# Patient Record
Sex: Female | Born: 1950 | Race: White | Hispanic: No | Marital: Married | Smoking: Never smoker
Health system: Southern US, Community
[De-identification: ages and names within clinical notes are randomized; demographics above are authoritative.]

## PROBLEM LIST (undated history)

## (undated) DIAGNOSIS — M25519 Pain in unspecified shoulder: Secondary | ICD-10-CM

## (undated) DIAGNOSIS — K219 Gastro-esophageal reflux disease without esophagitis: Secondary | ICD-10-CM

## (undated) DIAGNOSIS — G2581 Restless legs syndrome: Secondary | ICD-10-CM

## (undated) DIAGNOSIS — M8580 Other specified disorders of bone density and structure, unspecified site: Secondary | ICD-10-CM

## (undated) DIAGNOSIS — E039 Hypothyroidism, unspecified: Secondary | ICD-10-CM

## (undated) HISTORY — PX: TONSILLECTOMY AND ADENOIDECTOMY: SHX28

## (undated) HISTORY — PX: ABDOMINAL HYSTERECTOMY: SHX81

---

## 1978-08-09 HISTORY — PX: LAPAROSCOPIC OOPHERECTOMY: SHX6507

## 1998-08-09 HISTORY — PX: ABDOMINOPLASTY: SUR9

## 2004-06-10 ENCOUNTER — Ambulatory Visit: Payer: Self-pay | Admitting: Family Medicine

## 2004-12-08 LAB — HM COLONOSCOPY

## 2004-12-18 ENCOUNTER — Ambulatory Visit: Payer: Self-pay | Admitting: Unknown Physician Specialty

## 2006-08-09 HISTORY — PX: CHOLECYSTECTOMY: SHX55

## 2006-12-13 DIAGNOSIS — G4700 Insomnia, unspecified: Secondary | ICD-10-CM | POA: Insufficient documentation

## 2006-12-20 ENCOUNTER — Ambulatory Visit: Payer: Self-pay | Admitting: Family Medicine

## 2007-01-26 ENCOUNTER — Ambulatory Visit: Payer: Self-pay | Admitting: Family Medicine

## 2008-04-13 IMAGING — US ABDOMEN ULTRASOUND
1 series · 17 of 25 positions shown · non-contrast
Comparison: none

REASON FOR EXAM: abd pain  focus on gallbladder
COMMENTS:

[Series 1: abdomen ultrasound · 17 of 51 slices shown]
[im 1/51]
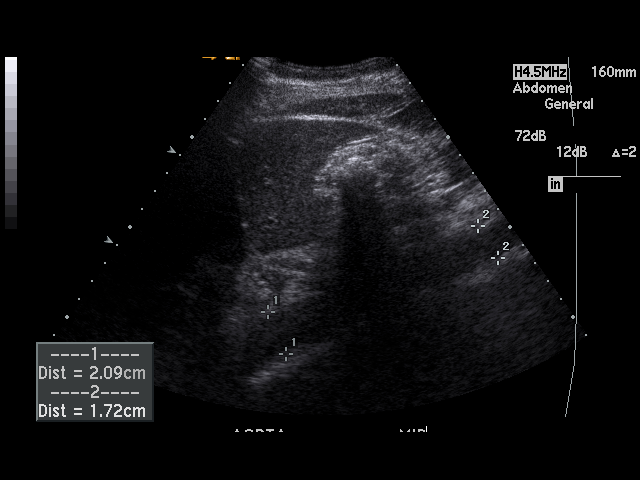
[im 5/51]
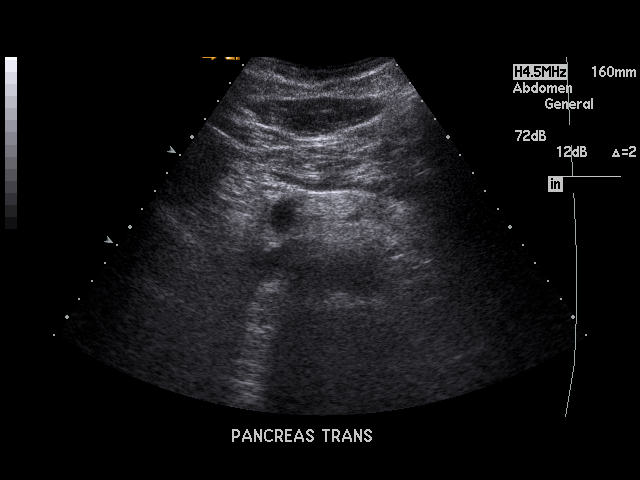
[im 7/51]
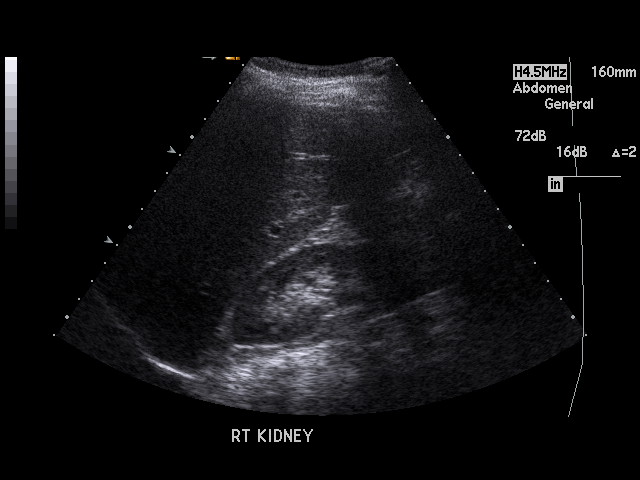
[im 11/51]
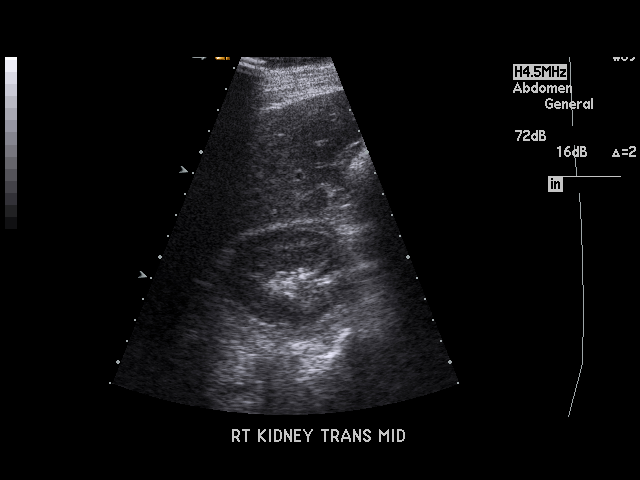
[im 13/51]
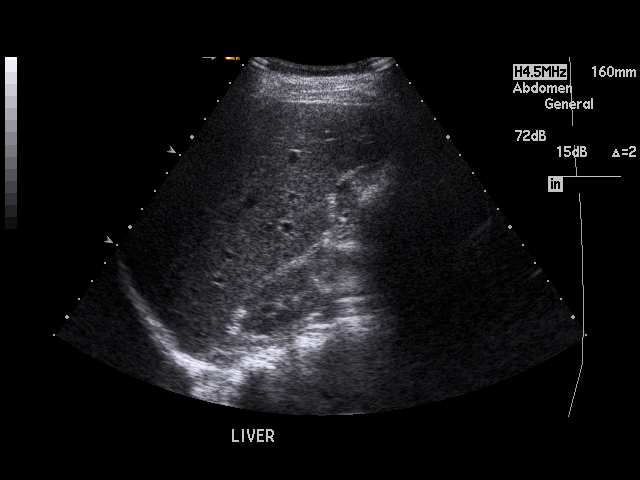
[im 17/51]
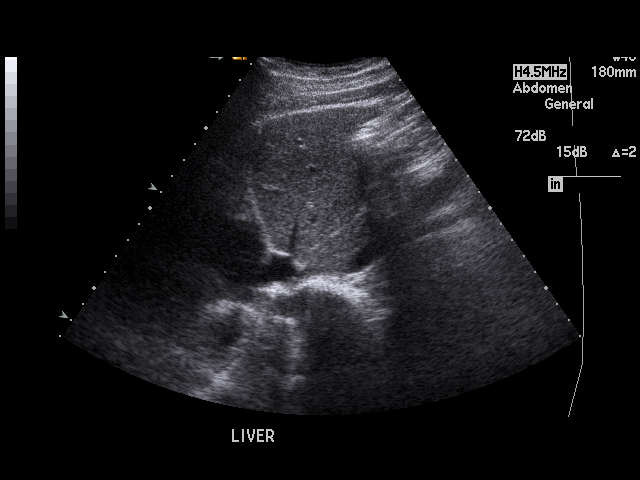
[im 19/51]
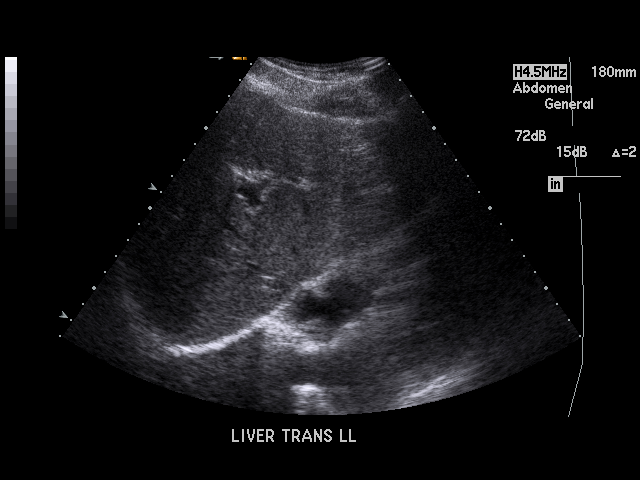
[im 23/51]
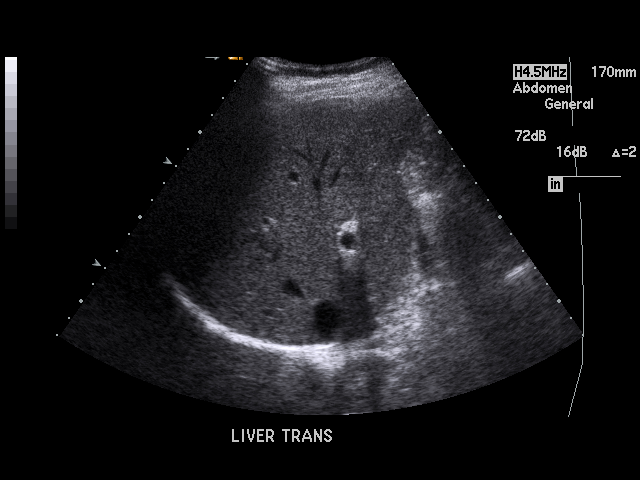
[im 26/51]
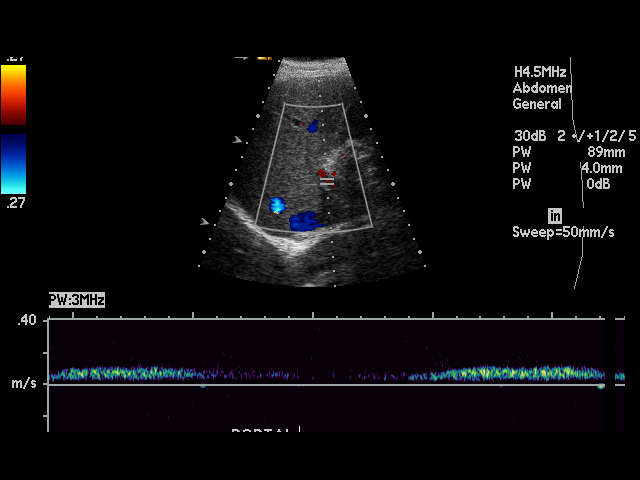
[im 28/51]
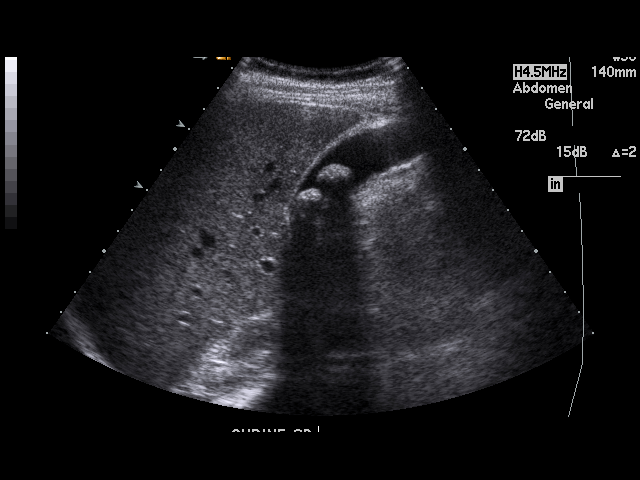
[im 32/51]
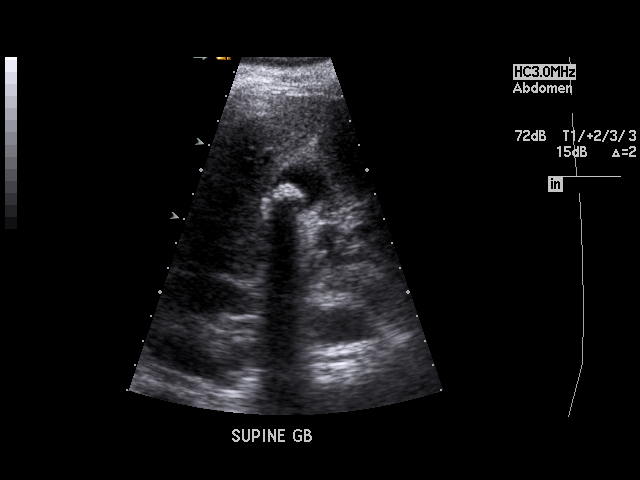
[im 34/51]
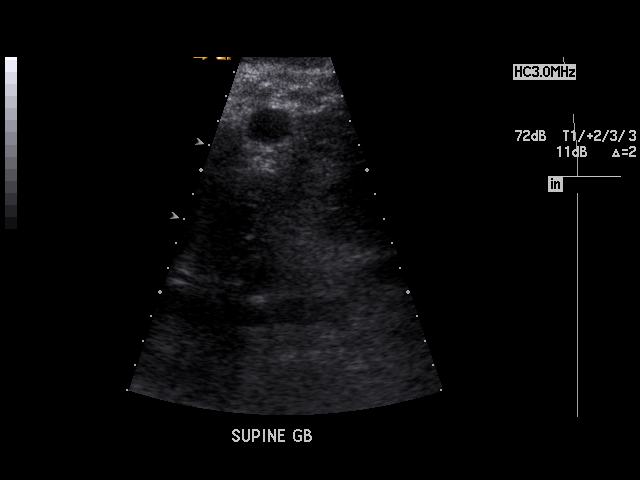
[im 38/51]
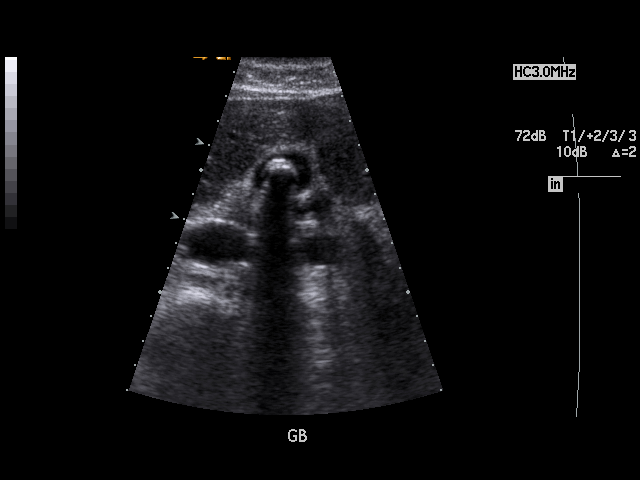
[im 40/51]
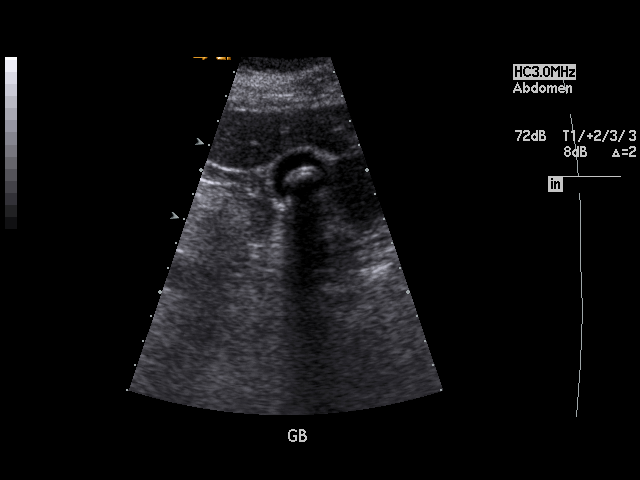
[im 44/51]
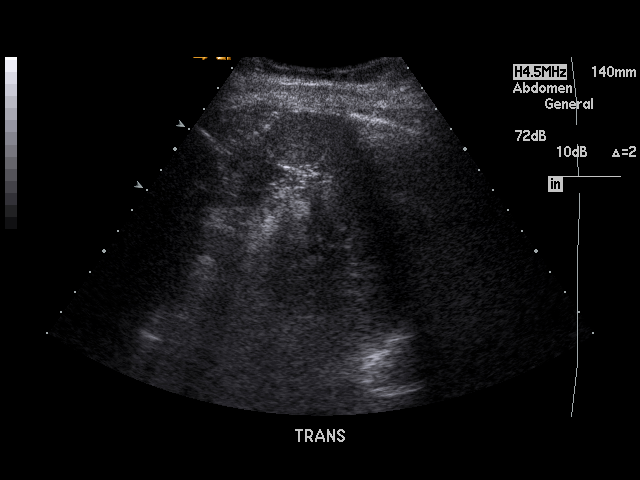
[im 46/51]
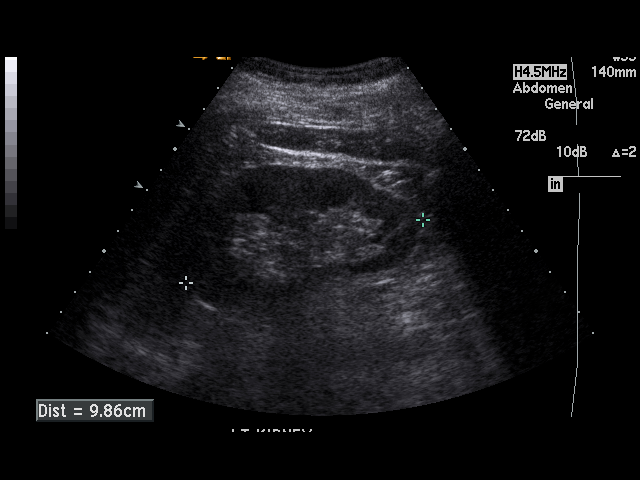
[im 51/51]
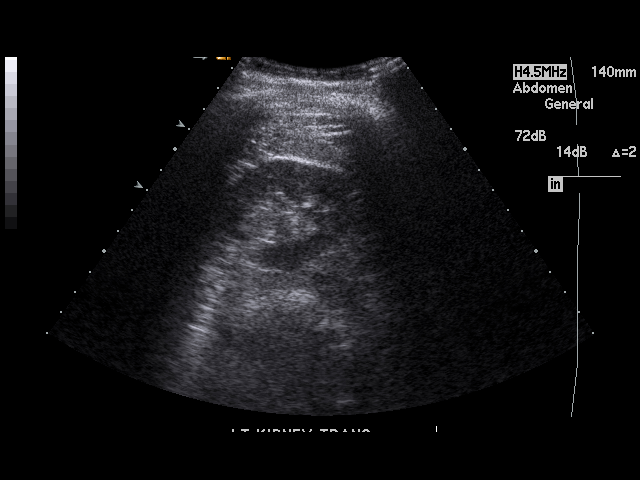

[17 of 25 positions shown; findings below may reference images not displayed]

PROCEDURE:     US  - US ABDOMEN GENERAL SURVEY  - December 20, 2006  [DATE]

RESULT:     The liver, spleen and abdominal aorta show no significant
abnormalities. The pancreas is not visualized adequately for evaluation on
this exam. There are noted at least two echo densities in the gallbladder
compatible gallstones. The larger measures 1.38 cm at maximum number. There
is no thickening of the gallbladder wall. The common bile duct measures
mm in diameter which is within normal limits. The kidney shadow
hydronephrosis. There is no ascites.
IMPRESSION: 1. Cholelithiasis.
2. The pancreas is not optimally visualized on this exam.

## 2008-05-01 ENCOUNTER — Ambulatory Visit: Payer: Self-pay | Admitting: Family Medicine

## 2009-04-25 DIAGNOSIS — M25519 Pain in unspecified shoulder: Secondary | ICD-10-CM | POA: Insufficient documentation

## 2009-05-28 ENCOUNTER — Ambulatory Visit: Payer: Self-pay | Admitting: Family Medicine

## 2010-07-07 ENCOUNTER — Ambulatory Visit: Payer: Self-pay | Admitting: Family Medicine

## 2011-10-25 LAB — HM DEXA SCAN

## 2012-02-15 ENCOUNTER — Ambulatory Visit: Payer: Self-pay | Admitting: Family Medicine

## 2013-07-23 ENCOUNTER — Ambulatory Visit: Payer: Self-pay | Admitting: Family Medicine

## 2013-09-05 ENCOUNTER — Ambulatory Visit: Payer: Self-pay | Admitting: Unknown Physician Specialty

## 2013-09-10 LAB — PATHOLOGY REPORT

## 2014-07-29 LAB — HM PAP SMEAR: HM PAP: NEGATIVE

## 2014-07-30 LAB — TSH: TSH: 4.97 u[IU]/mL (ref 0.41–5.90)

## 2014-07-30 LAB — LIPID PANEL
CHOLESTEROL: 211 mg/dL — AB (ref 0–200)
HDL: 62 mg/dL (ref 35–70)
LDL CALC: 118 mg/dL
TRIGLYCERIDES: 154 mg/dL (ref 40–160)

## 2014-07-30 LAB — BASIC METABOLIC PANEL
BUN: 18 mg/dL (ref 4–21)
Creatinine: 1 mg/dL (ref 0.5–1.1)
Glucose: 87 mg/dL
Potassium: 4.4 mmol/L (ref 3.4–5.3)
Sodium: 144 mmol/L (ref 137–147)

## 2014-07-30 LAB — CBC AND DIFFERENTIAL
HCT: 43 % (ref 36–46)
HEMOGLOBIN: 14.3 g/dL (ref 12.0–16.0)
PLATELETS: 284 10*3/uL (ref 150–399)
WBC: 7.8 10*3/mL

## 2014-07-30 LAB — HEPATIC FUNCTION PANEL
ALT: 16 U/L (ref 7–35)
AST: 16 U/L (ref 13–35)

## 2014-09-27 ENCOUNTER — Ambulatory Visit: Payer: Self-pay | Admitting: Family Medicine

## 2014-09-27 LAB — HM MAMMOGRAPHY

## 2015-01-25 ENCOUNTER — Other Ambulatory Visit: Payer: Self-pay | Admitting: Family Medicine

## 2015-01-25 DIAGNOSIS — G2581 Restless legs syndrome: Secondary | ICD-10-CM

## 2015-01-27 DIAGNOSIS — G2581 Restless legs syndrome: Secondary | ICD-10-CM | POA: Insufficient documentation

## 2015-01-28 ENCOUNTER — Other Ambulatory Visit: Payer: Self-pay | Admitting: Family Medicine

## 2015-01-28 DIAGNOSIS — G2581 Restless legs syndrome: Secondary | ICD-10-CM

## 2015-01-28 NOTE — Telephone Encounter (Signed)
Last refill and OV 07/29/2014. Pt currently in [place], [place]. May need a verbal order for this med. Allene Dillon, CMA

## 2015-05-05 ENCOUNTER — Other Ambulatory Visit: Payer: Self-pay | Admitting: Family Medicine

## 2015-05-05 DIAGNOSIS — G4700 Insomnia, unspecified: Secondary | ICD-10-CM

## 2015-05-05 NOTE — Telephone Encounter (Signed)
Needs ov in next few months. Thanks.

## 2015-05-05 NOTE — Telephone Encounter (Signed)
Last OV 07/2014  Thanks,   -Laura  

## 2015-06-24 ENCOUNTER — Encounter: Payer: Self-pay | Admitting: Family Medicine

## 2015-06-24 ENCOUNTER — Ambulatory Visit (INDEPENDENT_AMBULATORY_CARE_PROVIDER_SITE_OTHER): Payer: Federal, State, Local not specified - PPO | Admitting: Family Medicine

## 2015-06-24 VITALS — BP 122/82 | HR 88 | Temp 98.2°F | Resp 16 | Ht 68.5 in | Wt 204.0 lb

## 2015-06-24 DIAGNOSIS — E038 Other specified hypothyroidism: Secondary | ICD-10-CM

## 2015-06-24 DIAGNOSIS — Z1211 Encounter for screening for malignant neoplasm of colon: Secondary | ICD-10-CM | POA: Insufficient documentation

## 2015-06-24 DIAGNOSIS — E039 Hypothyroidism, unspecified: Secondary | ICD-10-CM

## 2015-06-24 DIAGNOSIS — R928 Other abnormal and inconclusive findings on diagnostic imaging of breast: Secondary | ICD-10-CM | POA: Insufficient documentation

## 2015-06-24 DIAGNOSIS — G2581 Restless legs syndrome: Secondary | ICD-10-CM

## 2015-06-24 DIAGNOSIS — M8580 Other specified disorders of bone density and structure, unspecified site: Secondary | ICD-10-CM | POA: Insufficient documentation

## 2015-06-24 DIAGNOSIS — K219 Gastro-esophageal reflux disease without esophagitis: Secondary | ICD-10-CM | POA: Diagnosis not present

## 2015-06-24 DIAGNOSIS — G4700 Insomnia, unspecified: Secondary | ICD-10-CM | POA: Diagnosis not present

## 2015-06-24 DIAGNOSIS — Z6829 Body mass index (BMI) 29.0-29.9, adult: Secondary | ICD-10-CM | POA: Insufficient documentation

## 2015-06-24 DIAGNOSIS — R631 Polydipsia: Secondary | ICD-10-CM | POA: Diagnosis not present

## 2015-06-24 DIAGNOSIS — E663 Overweight: Secondary | ICD-10-CM | POA: Insufficient documentation

## 2015-06-24 MED ORDER — OMEPRAZOLE 20 MG PO CPDR: 20.0000 mg | DELAYED_RELEASE_CAPSULE | Freq: Every day | ORAL | Status: DC

## 2015-06-24 MED ORDER — CLONAZEPAM 1 MG PO TABS: 1.0000 mg | ORAL_TABLET | Freq: Every day | ORAL | Status: DC

## 2015-06-24 NOTE — Progress Notes (Signed)
Subjective:    Patient ID: Janice Jensen, female    DOB: November 08, 1950, 64 y.o.   MRN: [id number]  Gastroesophageal Reflux She reports no abdominal pain, no belching, no chest pain, no choking, no coughing, no dysphagia, no globus sensation, no heartburn, no hoarse voice, no nausea, no sore throat, no water brash or no wheezing. This is a chronic problem. The problem has been gradually improving. Associated symptoms include fatigue. She has tried a PPI (Omeprazole 20 mg) for the symptoms. The treatment provided significant relief. Past procedures include an EGD.   Restless Leg Syndrome Pt currently takes Klonopin 1 mg po qhs for RLS. Pt reports significant relief with this medication. No side effects to medication.   Is having some fatigue. Does want her TSH rechecked. Also, does need to have a colonoscopy scheduled.     Review of Systems  Constitutional: Positive for fatigue.  HENT: Negative for hoarse voice and sore throat.   Respiratory: Negative for cough, choking and wheezing.   Cardiovascular: Negative for chest pain.  Gastrointestinal: Negative for heartburn, dysphagia, nausea and abdominal pain.  Endocrine: Positive for polydipsia.   BP 122/82 mmHg  Pulse 88  Temp(Src) 98.2 F (36.8 C) (Oral)  Resp 16  Ht 5' 8.5" (1.74 m)  Wt 204 lb (92.534 kg)  BMI 30.56 kg/m2   Patient Active Problem List   Diagnosis Date Noted  . Abnormal mammogram 06/24/2015  . Body mass index (BMI) of 29.0-29.9 in adult 06/24/2015  . Acid reflux 06/24/2015  . Excess weight 06/24/2015  . Osteopenia 06/24/2015  . Subclinical hypothyroidism 06/24/2015  . Restless leg 01/27/2015  . Arthralgia of shoulder 04/25/2009  . Cannot sleep 12/13/2006   No past medical history on file. Current Outpatient Prescriptions on File Prior to Visit  Medication Sig  . clonazePAM (KLONOPIN) 1 MG tablet TAKE 1 TABLET BY MOUTH EVERY DAY AT BEDTIME   No current facility-administered medications on file prior to  visit.   No Known Allergies Past Surgical History  Procedure Laterality Date  . Cholecystectomy  2008  . Abdominoplasty  2000  . Laparoscopic oopherectomy Bilateral 1980  . Abdominal hysterectomy    . Tonsillectomy and adenoidectomy     Social History   Social History  . Marital Status: Married    Spouse Name: N/A  . Number of Children: N/A  . Years of Education: N/A   Occupational History  . Not on file.   Social History Main Topics  . Smoking status: Never Smoker   . Smokeless tobacco: Never Used  . Alcohol Use: No  . Drug Use: No  . Sexual Activity: Not on file   Other Topics Concern  . Not on file   Social History Narrative   Family History  Problem Relation Age of Onset  . COPD Mother   . Lung cancer Father   . Cancer Sister     Laryngeal  . Drug abuse Brother   . Healthy Daughter   . Healthy Sister   . Bipolar disorder Sister       Objective:   Physical Exam  Constitutional: She is oriented to person, place, and time. She appears well-developed and well-nourished.  Neurological: She is alert and oriented to person, place, and time.  Psychiatric: She has a normal mood and affect. Her behavior is normal. Judgment and thought content normal.   BP 122/82 mmHg  Pulse 88  Temp(Src) 98.2 F (36.8 C) (Oral)  Resp 16  Ht 5' 8.5" (1.74 m)  Wt 204 lb (92.534 kg)  BMI 30.56 kg/m2      Assessment & Plan:  1. Gastroesophageal reflux disease, esophagitis presence not specified Condition is stable. Please continue current medication and  plan of care as noted.  Marland Kitchennjmcwin  - omeprazole (PRILOSEC) 20 MG capsule; Take 1 capsule (20 mg total) by mouth daily.  Dispense: 90 capsule; Refill: 1  2. Restless leg Stable. Continue current medication.   - clonazePAM (KLONOPIN) 1 MG tablet; Take 1 tablet (1 mg total) by mouth at bedtime.  Dispense: 90 tablet; Refill: 1  3. Cannot sleep Stable.    4. Subclinical hypothyroidism Time for low recheck. Has been mor  fatigued recently.   - T4 AND TSH  5. Increased thirst Worsening. Will check labs.   - Hemoglobin A1c  6. Colon cancer screening Will refer.   - Ambulatory referral to Gastroenterology   Lorie Phenix, MD

## 2015-06-25 ENCOUNTER — Telehealth: Payer: Self-pay

## 2015-06-25 LAB — T4 AND TSH
T4, Total: 7.5 ug/dL (ref 4.5–12.0)
TSH: 3.51 u[IU]/mL (ref 0.450–4.500)

## 2015-06-25 LAB — HEMOGLOBIN A1C
ESTIMATED AVERAGE GLUCOSE: 126 mg/dL
HEMOGLOBIN A1C: 6 % — AB (ref 4.8–5.6)

## 2015-06-25 NOTE — Telephone Encounter (Signed)
Pt advised.   Thanks,   -Laura  

## 2015-06-25 NOTE — Telephone Encounter (Signed)
-----   Message from Lorie PhenixNancy Maloney, MD sent at 06/25/2015 10:12 AM EST ----- Labs stable. Blood sugar is up some, in early pre-diabetes range. Make sure to eat healthy and exercise.  Recheck in 3 months. Thanks.

## 2015-06-27 ENCOUNTER — Other Ambulatory Visit: Payer: Self-pay

## 2015-06-27 DIAGNOSIS — K219 Gastro-esophageal reflux disease without esophagitis: Secondary | ICD-10-CM

## 2015-06-27 MED ORDER — OMEPRAZOLE 20 MG PO CPDR: 20.0000 mg | DELAYED_RELEASE_CAPSULE | Freq: Every day | ORAL | Status: DC

## 2015-07-23 ENCOUNTER — Encounter: Payer: Self-pay | Admitting: *Deleted

## 2015-07-24 ENCOUNTER — Encounter: Payer: Self-pay | Admitting: *Deleted

## 2015-07-24 ENCOUNTER — Encounter
Admission: RE | Disposition: A | Discharge status: Home / Self Care | Payer: Self-pay | Source: Ambulatory Visit | Attending: Unknown Physician Specialty

## 2015-07-24 ENCOUNTER — Ambulatory Visit
Admission: RE | Admit: 2015-07-24 | Discharge: 2015-07-24 | Disposition: A | Discharge status: Home / Self Care | Payer: Federal, State, Local not specified - PPO | Source: Ambulatory Visit | Attending: Unknown Physician Specialty | Admitting: Unknown Physician Specialty

## 2015-07-24 ENCOUNTER — Ambulatory Visit: Payer: Federal, State, Local not specified - PPO | Admitting: Anesthesiology

## 2015-07-24 DIAGNOSIS — Z79899 Other long term (current) drug therapy: Secondary | ICD-10-CM | POA: Insufficient documentation

## 2015-07-24 DIAGNOSIS — K219 Gastro-esophageal reflux disease without esophagitis: Secondary | ICD-10-CM | POA: Diagnosis not present

## 2015-07-24 DIAGNOSIS — K621 Rectal polyp: Secondary | ICD-10-CM | POA: Insufficient documentation

## 2015-07-24 DIAGNOSIS — M8580 Other specified disorders of bone density and structure, unspecified site: Secondary | ICD-10-CM | POA: Diagnosis not present

## 2015-07-24 DIAGNOSIS — Z1211 Encounter for screening for malignant neoplasm of colon: Secondary | ICD-10-CM | POA: Insufficient documentation

## 2015-07-24 DIAGNOSIS — E039 Hypothyroidism, unspecified: Secondary | ICD-10-CM | POA: Insufficient documentation

## 2015-07-24 DIAGNOSIS — E669 Obesity, unspecified: Secondary | ICD-10-CM | POA: Insufficient documentation

## 2015-07-24 DIAGNOSIS — G2581 Restless legs syndrome: Secondary | ICD-10-CM | POA: Diagnosis not present

## 2015-07-24 DIAGNOSIS — K5730 Diverticulosis of large intestine without perforation or abscess without bleeding: Secondary | ICD-10-CM | POA: Diagnosis not present

## 2015-07-24 DIAGNOSIS — Z6829 Body mass index (BMI) 29.0-29.9, adult: Secondary | ICD-10-CM | POA: Diagnosis not present

## 2015-07-24 HISTORY — DX: Pain in unspecified shoulder: M25.519

## 2015-07-24 HISTORY — PX: COLONOSCOPY WITH PROPOFOL: SHX5780

## 2015-07-24 HISTORY — DX: Hypothyroidism, unspecified: E03.9

## 2015-07-24 HISTORY — DX: Gastro-esophageal reflux disease without esophagitis: K21.9

## 2015-07-24 HISTORY — DX: Restless legs syndrome: G25.81

## 2015-07-24 HISTORY — DX: Other specified disorders of bone density and structure, unspecified site: M85.80

## 2015-07-24 SURGERY — COLONOSCOPY WITH PROPOFOL
Anesthesia: General

## 2015-07-24 MED ORDER — SODIUM CHLORIDE 0.9 % IV SOLN
INTRAVENOUS | Status: DC
  Administered 2015-07-24: 10:00:00 via INTRAVENOUS

## 2015-07-24 MED ORDER — PROPOFOL 500 MG/50ML IV EMUL
INTRAVENOUS | Status: DC | PRN
  Administered 2015-07-24: 120 ug/kg/min via INTRAVENOUS

## 2015-07-24 MED ORDER — PROPOFOL 10 MG/ML IV BOLUS
INTRAVENOUS | Status: DC | PRN
  Administered 2015-07-24: 50 mg via INTRAVENOUS

## 2015-07-24 MED ORDER — SODIUM CHLORIDE 0.9 % IV SOLN: INTRAVENOUS | Status: DC

## 2015-07-24 MED ORDER — FENTANYL CITRATE (PF) 100 MCG/2ML IJ SOLN
INTRAMUSCULAR | Status: DC | PRN
  Administered 2015-07-24: 50 ug via INTRAVENOUS

## 2015-07-24 MED ORDER — MIDAZOLAM HCL 5 MG/5ML IJ SOLN
INTRAMUSCULAR | Status: DC | PRN
  Administered 2015-07-24: 1 mg via INTRAVENOUS

## 2015-07-24 MED ORDER — LIDOCAINE HCL (CARDIAC) 20 MG/ML IV SOLN
INTRAVENOUS | Status: DC | PRN
  Administered 2015-07-24: 67 mg via INTRAVENOUS

## 2015-07-24 NOTE — Anesthesia Preprocedure Evaluation (Signed)
Anesthesia Evaluation  Patient identified by MRN, date of birth, ID band Patient awake    Reviewed: Allergy & Precautions, NPO status , Patient's Chart, lab work & pertinent test results  History of Anesthesia Complications Negative for: history of anesthetic complications  Airway Mallampati: II       Dental no notable dental hx.    Pulmonary neg pulmonary ROS,    breath sounds clear to auscultation       Cardiovascular Exercise Tolerance: Good  Rhythm:Regular     Neuro/Psych    GI/Hepatic Neg liver ROS, GERD  ,  Endo/Other  Hypothyroidism   Renal/GU negative Renal ROS     Musculoskeletal   Abdominal (+) + obese,   Peds negative pediatric ROS (+)  Hematology negative hematology ROS (+)   Anesthesia Other Findings   Reproductive/Obstetrics                             Anesthesia Physical Anesthesia Plan  ASA: II  Anesthesia Plan: General   Post-op Pain Management:    Induction: Intravenous  Airway Management Planned: Nasal Cannula  Additional Equipment:   Intra-op Plan:   Post-operative Plan:   Informed Consent: I have reviewed the patients History and Physical, chart, labs and discussed the procedure including the risks, benefits and alternatives for the proposed anesthesia with the patient or authorized representative who has indicated his/her understanding and acceptance.     Plan Discussed with: CRNA  Anesthesia Plan Comments:         Anesthesia Quick Evaluation

## 2015-07-24 NOTE — Transfer of Care (Signed)
Immediate Anesthesia Transfer of Care Note  Patient: Janice HittBrenda L Jensen  Procedure(s) Performed: Procedure(s): COLONOSCOPY WITH PROPOFOL (N/A)  Patient Location: PACU  Anesthesia Type:General  Level of Consciousness: sedated  Airway & Oxygen Therapy: Patient Spontanous Breathing and Patient connected to nasal cannula oxygen  Post-op Assessment: Report given to RN and Post -op Vital signs reviewed and stable  Post vital signs: Reviewed and stable  Last Vitals:  Filed Vitals:   07/24/15 1059 07/24/15 1100  BP: 97/60 97/60  Pulse: 60 64  Temp: 36 C   Resp: 13 15    Complications: No apparent anesthesia complications

## 2015-07-24 NOTE — Anesthesia Postprocedure Evaluation (Signed)
Anesthesia Post Note  Patient: Janice Jensen  Procedure(s) Performed: Procedure(s) (LRB): COLONOSCOPY WITH PROPOFOL (N/A)  Patient location during evaluation: PACU Anesthesia Type: General Level of consciousness: awake and alert Pain management: satisfactory to patient Vital Signs Assessment: post-procedure vital signs reviewed and stable Respiratory status: spontaneous breathing Cardiovascular status: stable Anesthetic complications: no    Last Vitals:  Filed Vitals:   07/24/15 0919 07/24/15 1050  BP: 136/82   Pulse: 51   Temp: 36.5 C 36 C  Resp: 18     Last Pain: There were no vitals filed for this visit.               VAN STAVEREN,Laquilla Dault

## 2015-07-24 NOTE — H&P (Signed)
   Primary Care Physician:  Lorie PhenixNancy Maloney, MD Primary Gastroenterologist:  Dr. Mechele CollinElliott  Pre-Procedure History & Physical: HPI:  Janice Jensen is a 64 y.o. female is here for an colonoscopy.   Past Medical History  Diagnosis Date  . GERD (gastroesophageal reflux disease)   . Osteopenia   . Hypothyroidism   . Restless leg syndrome   . Arthralgia of shoulder     Past Surgical History  Procedure Laterality Date  . Cholecystectomy  2008  . Abdominoplasty  2000  . Laparoscopic oopherectomy Bilateral 1980  . Abdominal hysterectomy    . Tonsillectomy and adenoidectomy      Prior to Admission medications   Medication Sig Start Date End Date Taking? Authorizing Provider  clonazePAM (KLONOPIN) 1 MG tablet Take 1 tablet (1 mg total) by mouth at bedtime. 06/24/15  Yes Lorie PhenixNancy Maloney, MD  omeprazole (PRILOSEC) 20 MG capsule Take 1 capsule (20 mg total) by mouth daily. 06/27/15  Yes Lorie PhenixNancy Maloney, MD    Allergies as of 07/21/2015  . (No Known Allergies)    Family History  Problem Relation Age of Onset  . COPD Mother   . Lung cancer Father   . Cancer Sister     Laryngeal  . Drug abuse Brother   . Healthy Daughter   . Healthy Sister   . Bipolar disorder Sister     Social History   Social History  . Marital Status: Married    Spouse Name: N/A  . Number of Children: N/A  . Years of Education: N/A   Occupational History  . Not on file.   Social History Main Topics  . Smoking status: Never Smoker   . Smokeless tobacco: Never Used  . Alcohol Use: No  . Drug Use: No  . Sexual Activity: Not on file   Other Topics Concern  . Not on file   Social History Narrative    Review of Systems: See HPI, otherwise negative ROS  Physical Exam: BP 136/82 mmHg  Pulse 51  Temp(Src) 97.7 F (36.5 C) (Tympanic)  Resp 18  Ht 5\' 8"  (1.727 m)  Wt 88.451 kg (195 lb)  BMI 29.66 kg/m2  SpO2 100% General:   Alert,  pleasant and cooperative in NAD Head:  Normocephalic and  atraumatic. Neck:  Supple; no masses or thyromegaly. Lungs:  Clear throughout to auscultation.    Heart:  Regular rate and rhythm. Abdomen:  Soft, nontender and nondistended. Normal bowel sounds, without guarding, and without rebound.   Neurologic:  Alert and  oriented x4;  grossly normal neurologically.  Impression/Plan: Janice Jensen is here for an colonoscopy to be performed for screening  Risks, benefits, limitations, and alternatives regarding  colonoscopy have been reviewed with the patient.  Questions have been answered.  All parties agreeable.   Lynnae PrudeELLIOTT, [place], MD  07/24/2015, 10:25 AM

## 2015-07-24 NOTE — Op Note (Signed)
[facility] Gastroenterology Patient Name: Janice NumbersBrenda Jensen Procedure Date: 07/24/2015 10:19 AM MRN: [id number] Account #: [id number] Date of Birth: 12/17/1950 Admit Type: Outpatient Age: [age] [facility] ROOM 1 Gender: Female Note Status: Finalized Procedure:         Colonoscopy Indications:       Screening for colorectal malignant neoplasm Providers:         Scot Junobert T. Elliott, MD Referring MD:      Leo GrosserNancy J. Maloney, MD (Referring MD) Medicines:         Propofol per Anesthesia Complications:     No immediate complications. Procedure:         Pre-Anesthesia Assessment:                    - After reviewing the risks and benefits, the patient was                     deemed in satisfactory condition to undergo the procedure.                    After obtaining informed consent, the colonoscope was                     passed under direct vision. Throughout the procedure, the                     patient's blood pressure, pulse, and oxygen saturations                     were monitored continuously. The Olympus PCF-H180AL                     colonoscope ( S#: [id number] ) was introduced through the                     anus and advanced to the the cecum, identified by                     appendiceal orifice and ileocecal valve. The colonoscopy                     was performed without difficulty. The patient tolerated                     the procedure well. The quality of the bowel preparation                     was excellent. Findings:      A diminutive polyp was found in the rectum. The polyp was sessile. The       polyp was removed with a jumbo cold forceps. Resection and retrieval       were complete.      A diminutive polyp was found in the rectum. The polyp was sessile. The       polyp was removed with a jumbo cold forceps. Resection and retrieval       were complete.      A single medium-mouthed diverticulum was found in the cecum.      The exam was otherwise  without abnormality. Impression:        - One diminutive polyp in the rectum. Resected and                     retrieved.                    -  One diminutive polyp in the rectum. Resected and                     retrieved.                    - Diverticulosis in the cecum.                    - The examination was otherwise normal. Recommendation:    - Await pathology results. Scot Jun, MD 07/24/2015 10:55:34 AM This report has been signed electronically. Number of Addenda: 0 Note Initiated On: 07/24/2015 10:19 AM Scope Withdrawal Time: 0 hours 10 minutes 24 seconds  Total Procedure Duration: 0 hours 16 minutes 6 seconds       [facility]

## 2015-07-25 LAB — SURGICAL PATHOLOGY

## 2015-07-26 ENCOUNTER — Encounter: Payer: Self-pay | Admitting: Unknown Physician Specialty

## 2015-10-22 ENCOUNTER — Encounter: Payer: Self-pay | Admitting: Family Medicine

## 2015-10-22 ENCOUNTER — Ambulatory Visit (INDEPENDENT_AMBULATORY_CARE_PROVIDER_SITE_OTHER): Payer: Federal, State, Local not specified - PPO | Admitting: Family Medicine

## 2015-10-22 VITALS — BP 106/68 | HR 64 | Temp 97.6°F | Resp 16 | Ht 67.0 in | Wt 202.0 lb

## 2015-10-22 DIAGNOSIS — E785 Hyperlipidemia, unspecified: Secondary | ICD-10-CM | POA: Insufficient documentation

## 2015-10-22 DIAGNOSIS — Z1231 Encounter for screening mammogram for malignant neoplasm of breast: Secondary | ICD-10-CM | POA: Diagnosis not present

## 2015-10-22 DIAGNOSIS — Z126 Encounter for screening for malignant neoplasm of bladder: Secondary | ICD-10-CM | POA: Diagnosis not present

## 2015-10-22 DIAGNOSIS — E038 Other specified hypothyroidism: Secondary | ICD-10-CM

## 2015-10-22 DIAGNOSIS — J019 Acute sinusitis, unspecified: Secondary | ICD-10-CM

## 2015-10-22 DIAGNOSIS — G2581 Restless legs syndrome: Secondary | ICD-10-CM | POA: Diagnosis not present

## 2015-10-22 DIAGNOSIS — Z0000 Encounter for general adult medical examination without abnormal findings: Secondary | ICD-10-CM

## 2015-10-22 DIAGNOSIS — E039 Hypothyroidism, unspecified: Secondary | ICD-10-CM

## 2015-10-22 DIAGNOSIS — K219 Gastro-esophageal reflux disease without esophagitis: Secondary | ICD-10-CM | POA: Diagnosis not present

## 2015-10-22 LAB — POCT URINALYSIS DIPSTICK
BILIRUBIN UA: NEGATIVE
Blood, UA: NEGATIVE
GLUCOSE UA: NEGATIVE
KETONES UA: NEGATIVE
Leukocytes, UA: NEGATIVE
Nitrite, UA: NEGATIVE
PROTEIN UA: NEGATIVE
Spec Grav, UA: >=1.03
Urobilinogen, UA: 0.2
pH, UA: 6

## 2015-10-22 MED ORDER — AMOXICILLIN-POT CLAVULANATE 875-125 MG PO TABS: 1.0000 | ORAL_TABLET | Freq: Two times a day (BID) | ORAL | Status: DC

## 2015-10-22 MED ORDER — CLONAZEPAM 1 MG PO TABS: 1.0000 mg | ORAL_TABLET | Freq: Every day | ORAL | Status: DC

## 2015-10-22 MED ORDER — OMEPRAZOLE 20 MG PO CPDR: 20.0000 mg | DELAYED_RELEASE_CAPSULE | Freq: Every day | ORAL | Status: AC

## 2015-10-22 NOTE — Progress Notes (Signed)
Patient: Janice Jensen, Female    DOB: 02-Mar-1951, 65 y.o.   MRN: [id number] Visit Date: 10/22/2015  Today's Provider: Lorie PhenixNancy Darrill Vreeland, MD   Chief Complaint  Patient presents with  . Annual Exam  . URI   Subjective:    Annual physical exam Janice HittBrenda L Jensen is a 65 y.o. female who presents today for health maintenance and complete physical. She feels fairly well. She reports exercising 1-3 times a week; walking or riding bicycle x 1 hour. She reports she is sleeping fairly well.  Last CPE- 07/29/2014 (labs stable- Lipids- total- 211, Trig-154, HDL- 62, LDL- 118) Last Pap- 07/29/2014- Negative Last Mammo- 09/27/2014- BI-RADS 1 Last Colonoscopy- 07/24/2015- Diverticulosis. Hyperplastic polyps. ----------------------------------------------------------------- Upper Respiratory Infection: Patient complains of symptoms of a URI. Symptoms include cough and sore throat. Onset of symptoms was 6 weeks ago, waxing and waning since that time. She also c/o fever unmeasured, pt had "fever blister", headache described as mild, nasal congestion and non productive cough.Treatment to date: OTC medications for cough, Alka Seltzer Plus.  Restless Leg Syndrome: Pt currently takes Klonopin 1 mg po qhs for this problem, with significant relief. Pt reports good good compliance with the treatment, and good sx control. Pt is requesting refills on this medication.   Review of Systems  Constitutional: Negative.   HENT: Positive for ear pain, rhinorrhea, sneezing and sore throat. Negative for congestion, dental problem, drooling, ear discharge, facial swelling, hearing loss, mouth sores, nosebleeds, postnasal drip, sinus pressure, tinnitus, trouble swallowing and voice change.   Eyes: Negative.   Respiratory: Positive for cough. Negative for apnea, choking, chest tightness, shortness of breath, wheezing and stridor.   Cardiovascular: Negative.   Gastrointestinal: Negative.   Endocrine: Negative.     Genitourinary: Negative.   Musculoskeletal: Negative.   Skin: Negative.   Allergic/Immunologic: Negative.   Neurological: Negative.   Hematological: Negative.   Psychiatric/Behavioral: Negative.     Social History      She  reports that she has never smoked. She has never used smokeless tobacco. She reports that she does not drink alcohol or use illicit drugs.       Social History   Social History  . Marital Status: Married    Spouse Name: Ron  . Number of Children: 2  . Years of Education: HS   Occupational History  . Retired    Social History Main Topics  . Smoking status: Never Smoker   . Smokeless tobacco: Never Used  . Alcohol Use: No  . Drug Use: No  . Sexual Activity: Not Asked   Other Topics Concern  . None   Social History Narrative    Past Medical History  Diagnosis Date  . GERD (gastroesophageal reflux disease)   . Osteopenia   . Hypothyroidism   . Restless leg syndrome   . Arthralgia of shoulder      Patient Active Problem List   Diagnosis Date Noted  . Abnormal mammogram 06/24/2015  . Body mass index (BMI) of 29.0-29.9 in adult 06/24/2015  . Acid reflux 06/24/2015  . Excess weight 06/24/2015  . Osteopenia 06/24/2015  . Subclinical hypothyroidism 06/24/2015  . Encounter for screening colonoscopy 06/24/2015  . Increased thirst 06/24/2015  . Restless leg 01/27/2015  . Arthralgia of shoulder 04/25/2009  . Cannot sleep 12/13/2006    Past Surgical History  Procedure Laterality Date  . Cholecystectomy  2008  . Abdominoplasty  2000  . Laparoscopic oopherectomy Bilateral 1980  . Abdominal hysterectomy    .  Tonsillectomy and adenoidectomy    . Colonoscopy with propofol N/A 07/24/2015    Procedure: COLONOSCOPY WITH PROPOFOL;  Surgeon: Scot Jun, MD;  Location: [facility] ENDOSCOPY;  Service: Endoscopy;  Laterality: N/A;    Family History        Family Status  Relation Status Death Age  . Mother Deceased 67  . Father Deceased   .  Sister Alive   . Brother Deceased     overdose  . Daughter Alive   . Sister Alive   . Sister Alive   . Son Alive         Her family history includes Bipolar disorder in her sister; COPD in her mother and sister; Cancer in her sister; Drug abuse in her brother; Healthy in her daughter, sister, and son; Lung cancer in her father.    No Known Allergies  Previous Medications   CLONAZEPAM (KLONOPIN) 1 MG TABLET    Take 1 tablet (1 mg total) by mouth at bedtime.   OMEPRAZOLE (PRILOSEC) 20 MG CAPSULE    Take 1 capsule (20 mg total) by mouth daily.    Patient Care Team: Lorie Phenix, MD as PCP - General (Family Medicine)     Objective:   Vitals: BP 106/68 mmHg  Pulse 64  Temp(Src) 97.6 F (36.4 C) (Oral)  Resp 16  Ht  (1.702 m)  Wt 202 lb (91.627 kg)  BMI 31.63 kg/m2  SpO2 98%   Physical Exam  Constitutional: She is oriented to person, place, and time. She appears well-developed and well-nourished.  HENT:  Head: Normocephalic and atraumatic.  Right Ear: External ear and ear canal normal. A middle ear effusion is present.  Left Ear: External ear and ear canal normal. A middle ear effusion is present.  Mouth/Throat: Uvula is midline, oropharynx is clear and moist and mucous membranes are normal.  Turbinates are erythematous and swollen  Eyes: Conjunctivae, EOM and lids are normal. Pupils are equal, round, and reactive to light.  Neck: Trachea normal and normal range of motion. Neck supple. Carotid bruit is not present. No thyroid mass and no thyromegaly present.  Cardiovascular: Normal rate, regular rhythm and normal heart sounds.   Pulmonary/Chest: Effort normal and breath sounds normal.  Abdominal: Soft. Normal appearance and bowel sounds are normal. There is no hepatosplenomegaly. There is no tenderness.  Genitourinary: No breast swelling, tenderness or discharge.  Musculoskeletal: Normal range of motion.  Lymphadenopathy:    She has no cervical adenopathy.    She has  no axillary adenopathy.  Neurological: She is alert and oriented to person, place, and time. She has normal strength. No cranial nerve deficit.  Skin: Skin is warm, dry and intact.  Psychiatric: She has a normal mood and affect. Her speech is normal and behavior is normal. Judgment and thought content normal. Cognition and memory are normal.     Depression Screen PHQ 2/9 Scores 10/22/2015  PHQ - 2 Score 0      Assessment & Plan:     Routine Health Maintenance and Physical Exam  Exercise Activities and Dietary recommendations Goals    None      Immunization History  Administered Date(s) Administered  . Influenza, High Dose Seasonal PF 05/16/2015  . Tdap 10/20/2005  . Zoster 10/22/2011    Health Maintenance  Topic Date Due  . HIV Screening  08/25/1966  . TETANUS/TDAP  10/21/2015  . INFLUENZA VACCINE  05/09/2016 (Originally 03/09/2016)  . MAMMOGRAM  09/27/2016  . PAP SMEAR  07/29/2017  .  COLONOSCOPY  07/23/2025  . ZOSTAVAX  Completed  . Hepatitis C Screening  Completed      Discussed health benefits of physical activity, and encouraged her to engage in regular exercise appropriate for her age and condition.    --------------------------------------------------------------------   1. Annual physical exam Stable. As above.  2. Bladder cancer screening UA negative. - POCT urinalysis dipstick  Results for orders placed or performed in visit on 10/22/15  POCT urinalysis dipstick  Result Value Ref Range   Color, UA Amber    Clarity, UA Clear    Glucose, UA Negative    Bilirubin, UA Negative    Ketones, UA Negative    Spec Grav, UA >=1.030    Blood, UA Negative    pH, UA 6.0    Protein, UA Negative    Urobilinogen, UA 0.2    Nitrite, UA Negative    Leukocytes, UA Negative Negative    3. Subclinical hypothyroidism Pt has H/O this. FU pending results. - TSH  4. Gastroesophageal reflux disease, esophagitis presence not specified Stable. Refill medication  as below. - omeprazole (PRILOSEC) 20 MG capsule; Take 1 capsule (20 mg total) by mouth daily.  Dispense: 90 capsule; Refill: 3  5. Restless leg Stable. Refill medication as below. - clonazePAM (KLONOPIN) 1 MG tablet; Take 1 tablet (1 mg total) by mouth at bedtime.  Dispense: 90 tablet; Refill: 1  6. Hyperlipidemia Pt has H/O this. FU pending results. - Lipid panel - CBC with Differential/Platelet - Comprehensive metabolic panel  7. Acute sinusitis, recurrence not specified, unspecified location New problem. Treat as below. - amoxicillin-clavulanate (AUGMENTIN) 875-125 MG tablet; Take 1 tablet by mouth 2 (two) times daily.  Dispense: 20 tablet; Refill: 0  8. Encounter for screening mammogram for breast cancer Pt will call to schedule. - MM DIGITAL SCREENING BILATERAL; Future   Patient seen and examined by Leo Grosser, MD, and note scribed by Allene Dillon, CMA.  I have reviewed the document for accuracy and completeness and I agree with above. Leo Grosser, MD   Lorie Phenix, MD

## 2015-10-23 LAB — CBC WITH DIFFERENTIAL/PLATELET
BASOS ABS: 0.1 10*3/uL (ref 0.0–0.2)
Basos: 1 %
EOS (ABSOLUTE): 0.2 10*3/uL (ref 0.0–0.4)
Eos: 3 %
Hematocrit: 43.4 % (ref 34.0–46.6)
Hemoglobin: 14.5 g/dL (ref 11.1–15.9)
IMMATURE GRANS (ABS): 0 10*3/uL (ref 0.0–0.1)
IMMATURE GRANULOCYTES: 0 %
LYMPHS: 41 %
Lymphocytes Absolute: 3.2 10*3/uL — ABNORMAL HIGH (ref 0.7–3.1)
MCH: 31.2 pg (ref 26.6–33.0)
MCHC: 33.4 g/dL (ref 31.5–35.7)
MCV: 93 fL (ref 79–97)
MONOCYTES: 7 %
Monocytes Absolute: 0.5 10*3/uL (ref 0.1–0.9)
NEUTROS PCT: 48 %
Neutrophils Absolute: 3.7 10*3/uL (ref 1.4–7.0)
PLATELETS: 329 10*3/uL (ref 150–379)
RBC: 4.65 x10E6/uL (ref 3.77–5.28)
RDW: 13.5 % (ref 12.3–15.4)
WBC: 7.8 10*3/uL (ref 3.4–10.8)

## 2015-10-23 LAB — LIPID PANEL
CHOL/HDL RATIO: 4.3 ratio (ref 0.0–4.4)
CHOLESTEROL TOTAL: 217 mg/dL — AB (ref 100–199)
HDL: 50 mg/dL (ref >39)
LDL CALC: 136 mg/dL — AB (ref 0–99)
Triglycerides: 155 mg/dL — ABNORMAL HIGH (ref 0–149)
VLDL Cholesterol Cal: 31 mg/dL (ref 5–40)

## 2015-10-23 LAB — COMPREHENSIVE METABOLIC PANEL
ALBUMIN: 4.2 g/dL (ref 3.6–4.8)
ALK PHOS: 81 IU/L (ref 39–117)
ALT: 12 IU/L (ref 0–32)
AST: 15 IU/L (ref 0–40)
Albumin/Globulin Ratio: 1.5 (ref 1.2–2.2)
BUN / CREAT RATIO: 13 (ref 11–26)
BUN: 13 mg/dL (ref 8–27)
Bilirubin Total: 0.3 mg/dL (ref 0.0–1.2)
CALCIUM: 9.5 mg/dL (ref 8.7–10.3)
CO2: 25 mmol/L (ref 18–29)
CREATININE: 1 mg/dL (ref 0.57–1.00)
Chloride: 103 mmol/L (ref 96–106)
GFR calc Af Amer: 69 mL/min/{1.73_m2} (ref >59)
GFR, EST NON AFRICAN AMERICAN: 60 mL/min/{1.73_m2} (ref >59)
GLOBULIN, TOTAL: 2.8 g/dL (ref 1.5–4.5)
GLUCOSE: 85 mg/dL (ref 65–99)
Potassium: 4.7 mmol/L (ref 3.5–5.2)
Sodium: 144 mmol/L (ref 134–144)
TOTAL PROTEIN: 7 g/dL (ref 6.0–8.5)

## 2015-10-23 LAB — TSH: TSH: 4.54 u[IU]/mL — ABNORMAL HIGH (ref 0.450–4.500)

## 2015-10-24 ENCOUNTER — Encounter: Payer: Self-pay | Admitting: Family Medicine

## 2015-11-03 ENCOUNTER — Telehealth: Payer: Self-pay | Admitting: Family Medicine

## 2015-11-03 MED ORDER — AZITHROMYCIN 250 MG PO TABS: ORAL_TABLET | ORAL | Status: AC

## 2015-11-03 NOTE — Telephone Encounter (Signed)
Pt states she was seen on 10/22/2015 by Dr Elease HashimotoMaloney and only took 2 day of the Rx, amoxicillin-clavulanate (AUGMENTIN) 875-125 MG tablet.  Pt states this gave her diarrhea and she had to stop taking this.  Pt is requesting a different Rx to help with sinus congestion and ear pain.  Pt states she is out of town.  [facility] Phone #[phone] [address] [place].  WG#[phone]/HQCB#[phone]/MW

## 2015-11-03 NOTE — Telephone Encounter (Signed)
Patient advised as below.  

## 2015-11-03 NOTE — Telephone Encounter (Signed)
Can send in rx for Zpack for 5 days. Should also take OTC pro-biotic until finished with medication

## 2015-11-10 ENCOUNTER — Telehealth: Payer: Self-pay | Admitting: Family Medicine

## 2015-11-10 MED ORDER — DOXYCYCLINE HYCLATE 100 MG PO TABS: 100.0000 mg | ORAL_TABLET | Freq: Two times a day (BID) | ORAL | Status: AC

## 2015-11-10 NOTE — Telephone Encounter (Signed)
Was previously on Augmentin. Please advise. Allene DillonEmily Drozdowski, CMA

## 2015-11-10 NOTE — Telephone Encounter (Signed)
Pt states she rec'd and antibiotic 10/22/2015 and only took 2 days worth due to having diarrhea.  Pt called last week and ask for something different and rec'd a z pack from Dr Sherrie MustacheFisher.  Pt states took the entire Rx.  Pt states she is still having sinus pressure, headaches, dizziness and eye pain.  Pt states she is out of town and can not come in.  [facility] [address] [place], [place].  JX#[phone]/ZHCB#[phone]/MW

## 2015-11-10 NOTE — Telephone Encounter (Signed)
Left message advising pt.   Thanks,   -Cadince Hilscher  

## 2015-11-10 NOTE — Telephone Encounter (Signed)
Sent rx. Please notify patient. Thanks.   

## 2016-04-27 ENCOUNTER — Other Ambulatory Visit: Payer: Self-pay | Admitting: Family Medicine

## 2016-04-27 DIAGNOSIS — G2581 Restless legs syndrome: Secondary | ICD-10-CM

## 2016-04-28 NOTE — Telephone Encounter (Signed)
RX called in at [facility]  

## 2016-10-28 ENCOUNTER — Other Ambulatory Visit: Payer: Self-pay

## 2016-10-28 DIAGNOSIS — G2581 Restless legs syndrome: Secondary | ICD-10-CM

## 2016-10-28 MED ORDER — CLONAZEPAM 1 MG PO TABS: 1.0000 mg | ORAL_TABLET | Freq: Every day | ORAL | 0 refills | Status: AC

## 2016-10-28 NOTE — Telephone Encounter (Signed)
Called in to [facility] 

## 2020-12-07 DEATH — deceased
